# Patient Record
Sex: Female | Born: 1960 | Race: Black or African American | Hispanic: No | Marital: Single
Health system: Southern US, Community
[De-identification: ages and names within clinical notes are randomized; demographics above are authoritative.]

---

## 2019-01-07 ENCOUNTER — Other Ambulatory Visit: Payer: Self-pay

## 2019-01-07 ENCOUNTER — Emergency Department (HOSPITAL_COMMUNITY)
Admission: EM | Admit: 2019-01-07 | Discharge: 2019-01-08 | Disposition: A | Discharge status: Home / Self Care | Payer: BLUE CROSS/BLUE SHIELD | Attending: Emergency Medicine | Admitting: Emergency Medicine

## 2019-01-07 ENCOUNTER — Encounter (HOSPITAL_COMMUNITY): Payer: Self-pay

## 2019-01-07 DIAGNOSIS — M545 Low back pain, unspecified: Secondary | ICD-10-CM

## 2019-01-07 DIAGNOSIS — R1012 Left upper quadrant pain: Secondary | ICD-10-CM | POA: Diagnosis present

## 2019-01-07 NOTE — ED Triage Notes (Signed)
Pt arrived with complaints of left sided back pain, patient describes it as a burning pain. Denies any injury. States she has been taking miralax, reports a hard stool yesterday. Denies any symptoms when urinating.

## 2019-01-07 NOTE — ED Provider Notes (Signed)
[facility] DEPT Provider Note   CSN: [id number] Arrival date & time: 01/07/19  2258     History   Chief Complaint Chief Complaint  Patient presents with  . Back Pain    HPI Christine Hoover is a 58 y.o. female presented today with 2-day history of left flank pain.  Patient reports a moderate intensity burning sensation constant without clear aggravating or alleviating factors or radiation that began 2 days ago, no clear inciting incident.  She denies fever/chills, dysuria/hematuria, nausea/vomiting, abdominal pain, chest pain/shortness of breath, cough/hemoptysis, extremity pain/swelling, fall/injury, vaginal bleeding/discharge or any additional concerns.  Patient reports that she is an otherwise healthy 58 year old female with no known medical conditions or daily medication use.    HPI  History reviewed. No pertinent past medical history.  There are no active problems to display for this patient.   History reviewed. No pertinent surgical history.   OB History   No obstetric history on file.      Home Medications    Prior to Admission medications   Not on File    Family History No family history on file.  Social History Social History   Tobacco Use  . Smoking status: Not on file  Substance Use Topics  . Alcohol use: Not on file  . Drug use: Not on file     Allergies   Patient has no known allergies.   Review of Systems Review of Systems  Constitutional: Negative.  Negative for chills and fever.  Respiratory: Negative.  Negative for cough and shortness of breath.   Cardiovascular: Negative.  Negative for chest pain and leg swelling.  Gastrointestinal: Positive for constipation. Negative for abdominal pain, diarrhea, nausea and vomiting.  Genitourinary: Positive for flank pain. Negative for dysuria, hematuria, pelvic pain, vaginal bleeding and vaginal discharge.  Musculoskeletal: Negative for arthralgias and myalgias.   Neurological: Negative.  Negative for weakness and numbness.  All other systems reviewed and are negative.  Physical Exam Updated Vital Signs BP (!) 125/99 (BP Location: Right Arm)   Pulse 89   Temp 98 F (36.7 C) (Oral)   Resp 18   Ht 5\' 6"  (1.676 m)   Wt 79.8 kg   SpO2 99%   BMI 28.41 kg/m   Physical Exam Constitutional:      General: She is not in acute distress.    Appearance: Normal appearance. She is well-developed. She is obese. She is not ill-appearing or diaphoretic.  HENT:     Head: Normocephalic and atraumatic.     Right Ear: External ear normal.     Left Ear: External ear normal.     Nose: Nose normal.  Eyes:     General: Vision grossly intact. Gaze aligned appropriately.     Pupils: Pupils are equal, round, and reactive to light.  Neck:     Musculoskeletal: Normal range of motion.     Trachea: Trachea and phonation normal. No tracheal deviation.  Cardiovascular:     Rate and Rhythm: Normal rate and regular rhythm.     Pulses:          Dorsalis pedis pulses are 2+ on the right side and 2+ on the left side.  Pulmonary:     Effort: Pulmonary effort is normal. No respiratory distress.  Abdominal:     General: There is no distension.     Palpations: Abdomen is soft.     Tenderness: There is no abdominal tenderness. There is left CVA tenderness. There is  no right CVA tenderness, guarding or rebound. Negative signs include Murphy's sign and McBurney's sign.  Genitourinary:    Comments: Deferred by patient Musculoskeletal: Normal range of motion.     Comments: No midline C/T/L spinal tenderness to palpation, no paraspinal muscle tenderness, no deformity, crepitus, or step-off noted. No sign of injury to the neck or back.  Feet:     Right foot:     Protective Sensation: 3 sites tested. 3 sites sensed.     Left foot:     Protective Sensation: 3 sites tested. 3 sites sensed.  Skin:    General: Skin is warm and dry.     Findings: No ecchymosis, erythema, rash or  wound.  Neurological:     Mental Status: She is alert.     GCS: GCS eye subscore is 4. GCS verbal subscore is 5. GCS motor subscore is 6.     Comments: Speech is clear and goal oriented, follows commands Major Cranial nerves without deficit, no facial droop Moves extremities without ataxia, coordination intact  Psychiatric:        Behavior: Behavior normal.    ED Treatments / Results  Labs (all labs ordered are listed, but only abnormal results are displayed) Labs Reviewed - No data to display  EKG None  Radiology No results found.  Procedures Procedures (including critical care time)  Medications Ordered in ED Medications - No data to display   Initial Impression / Assessment and Plan / ED Course  I have reviewed the triage vital signs and the nursing notes.  Pertinent labs & imaging results that were available during my care of the patient were reviewed by me and considered in my medical decision making (see chart for details).    CBC nonacute Beta-hCG negative Urinalysis with positive nitrates, 6-10 white blood cells, rare bacteria, greater than 500 glucose - Lipase, CMP and CT renal stone study pending.  Pain medication ordered. - Care handoff given to Graciella FreerLindsey Layden, PA-C at shift change, plan of care at this time is to await remainder of blood work, CT imaging, reassess and disposition.    Note: Portions of this report may have been transcribed using voice recognition software. Every effort was made to ensure accuracy; however, inadvertent computerized transcription errors may still be present. Final Clinical Impressions(s) / ED Diagnoses   Final diagnoses:  None    ED Discharge Orders    None       Elizabeth PalauMorelli, Brandon A, PA-C 01/08/19 0046    Linwood DibblesKnapp, Jon, MD 01/18/19 [phone]

## 2019-01-08 ENCOUNTER — Emergency Department (HOSPITAL_COMMUNITY): Payer: BLUE CROSS/BLUE SHIELD

## 2019-01-08 LAB — URINALYSIS, ROUTINE W REFLEX MICROSCOPIC
Bilirubin Urine: NEGATIVE
Glucose, UA: >=500 mg/dL — AB
Hgb urine dipstick: NEGATIVE
Ketones, ur: NEGATIVE mg/dL
Leukocytes,Ua: NEGATIVE
Nitrite: POSITIVE — AB
Protein, ur: NEGATIVE mg/dL
Specific Gravity, Urine: 1.028 (ref 1.005–1.030)
pH: 6 (ref 5.0–8.0)

## 2019-01-08 LAB — CBC WITH DIFFERENTIAL/PLATELET
Abs Immature Granulocytes: 0.03 10*3/uL (ref 0.00–0.07)
Basophils Absolute: 0 10*3/uL (ref 0.0–0.1)
Basophils Relative: 0 %
Eosinophils Absolute: 0.1 10*3/uL (ref 0.0–0.5)
Eosinophils Relative: 1 %
HCT: 46.5 % — ABNORMAL HIGH (ref 36.0–46.0)
Hemoglobin: 15.1 g/dL — ABNORMAL HIGH (ref 12.0–15.0)
Immature Granulocytes: 0 %
Lymphocytes Relative: 42 %
Lymphs Abs: 3.7 10*3/uL (ref 0.7–4.0)
MCH: 30.1 pg (ref 26.0–34.0)
MCHC: 32.5 g/dL (ref 30.0–36.0)
MCV: 92.6 fL (ref 80.0–100.0)
Monocytes Absolute: 0.7 10*3/uL (ref 0.1–1.0)
Monocytes Relative: 8 %
Neutro Abs: 4.3 10*3/uL (ref 1.7–7.7)
Neutrophils Relative %: 49 %
Platelets: 214 10*3/uL (ref 150–400)
RBC: 5.02 MIL/uL (ref 3.87–5.11)
RDW: 13.4 % (ref 11.5–15.5)
WBC: 8.9 10*3/uL (ref 4.0–10.5)
nRBC: 0 % (ref 0.0–0.2)

## 2019-01-08 LAB — COMPREHENSIVE METABOLIC PANEL
ALT: 19 U/L (ref 0–44)
AST: 22 U/L (ref 15–41)
Albumin: 3.8 g/dL (ref 3.5–5.0)
Alkaline Phosphatase: 179 U/L — ABNORMAL HIGH (ref 38–126)
Anion gap: 6 (ref 5–15)
BUN: 13 mg/dL (ref 6–20)
CO2: 27 mmol/L (ref 22–32)
Calcium: 9.3 mg/dL (ref 8.9–10.3)
Chloride: 105 mmol/L (ref 98–111)
Creatinine, Ser: 0.85 mg/dL (ref 0.44–1.00)
GFR calc Af Amer: >60 mL/min (ref >60)
GFR calc non Af Amer: >60 mL/min (ref >60)
Glucose, Bld: 356 mg/dL — ABNORMAL HIGH (ref 70–99)
Potassium: 3.9 mmol/L (ref 3.5–5.1)
Sodium: 138 mmol/L (ref 135–145)
Total Bilirubin: 0.5 mg/dL (ref 0.3–1.2)
Total Protein: 7.4 g/dL (ref 6.5–8.1)

## 2019-01-08 LAB — I-STAT BETA HCG BLOOD, ED (MC, WL, AP ONLY): I-stat hCG, quantitative: <5 m[IU]/mL (ref <5)

## 2019-01-08 LAB — LIPASE, BLOOD: Lipase: 24 U/L (ref 11–51)

## 2019-01-08 MED ORDER — MORPHINE SULFATE (PF) 2 MG/ML IV SOLN
2.0000 mg | Freq: Once | INTRAVENOUS | Status: AC
  Administered 2019-01-08: 01:00:00 2 mg via INTRAVENOUS
  Filled 2019-01-08: qty 1

## 2019-01-08 MED ORDER — LIDOCAINE 5 % EX PTCH: 1.0000 | MEDICATED_PATCH | CUTANEOUS | 0 refills | Status: AC

## 2019-01-08 NOTE — ED Provider Notes (Signed)
Care assumed from Nuala Alpha, [place] with labs and imaging pending.  In brief this is a 58 year old female presented with 2-day history of left flank pain.  Reports a burning sensation that is constant.  She denies fever, chills, urinary symptoms, nausea/vomiting.  Please see note from previous provider for full history/physical exam.     Physical Exam  BP (!) 125/99 (BP Location: Right Arm)   Pulse 89   Temp 98 F (36.7 C) (Oral)   Resp 18   Ht '5\' 6"'$  (1.676 m)   Wt 79.8 kg   SpO2 99%   BMI 28.41 kg/m   Physical Exam  Moving all extremities without any difficulty. Abdomen is soft, non-distended, non-tender. No rigidity, No guarding. No peritoneal signs.  ED Course/Procedures     Procedures  MDM    Plan: Patient pending labs and imaging.  Suspect that this may be kidney stone related.  Plan for discharge home if work-up unremarkable.  I-stat beta negative.  Lipase unremarkable.  CMP shows glucose of 356 and alk phos of 179.  CBC with no leukocytosis.  Hemoglobin stable at 15.1.  UA does show positive nitrites, bacturia, pyuria.  CT shows no renal stones.  No evidence of acute abnormalities noted in the abdomen or pelvis.  There is mention of large colonic stool burden suggesting constipation.  Also there is a small fat-containing umbilical hernia.  Discussed results with patient's.  Patient resting comfortably in bed with no signs of distress.  Encouraged at home supportive care measures. At this time, patient exhibits no emergent life-threatening condition that require further evaluation in ED or admission. Patient had ample opportunity for questions and discussion. All patient's questions were answered with full understanding. Strict return precautions discussed. Patient expresses understanding and agreement to plan.   Portions of this note were generated with [employer]. Dictation errors may occur despite best attempts at proofreading.       Volanda Napoleon, PA-C 01/08/19 [id number]    Randal Buba, April, MD 01/08/19 [phone]

## 2019-01-08 NOTE — Discharge Instructions (Addendum)
As we discussed your CT scan showed that you are constipated.  Continue taking MiraLAX.  Use Lidoderm patch as directed.  Return the emergency department for any worsening pain, fevers or any other worsening or concerning symptoms.

## 2019-12-17 IMAGING — CT CT RENAL STONE PROTOCOL
2 of 4 series · 16 of 46 positions shown, 18 images · non-contrast
Comparison: None.

CLINICAL DATA: Left flank pain.

EXAM:
CT ABDOMEN AND PELVIS WITHOUT CONTRAST
TECHNIQUE: Multidetector CT imaging of the abdomen and pelvis was performed
following the standard protocol without IV contrast.

[Series 2: axial st · axial · 0.64mm/px · z∈[-470,-110]mm · 13 of 81 slices shown, 15 images]
[im 5/81  soft-tissue]
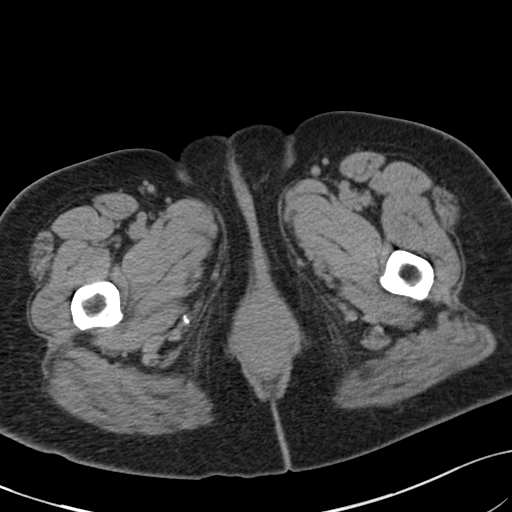
[im 5/81  bone]
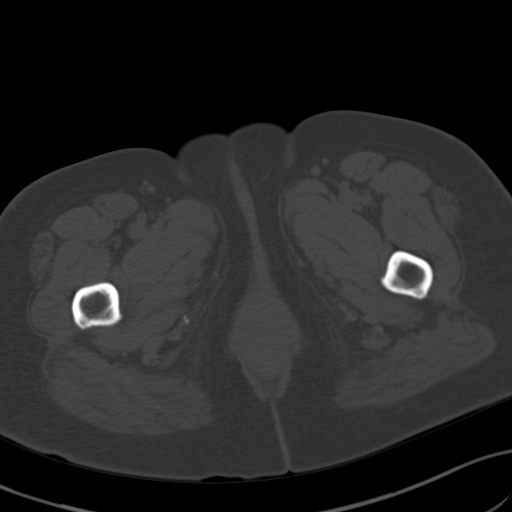
[im 13/81  soft-tissue]
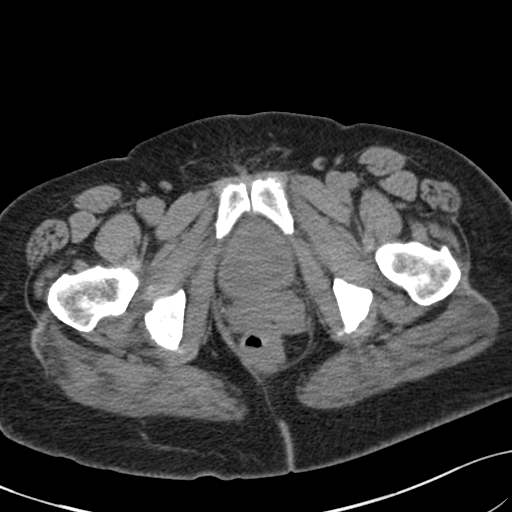
[im 17/81  soft-tissue]
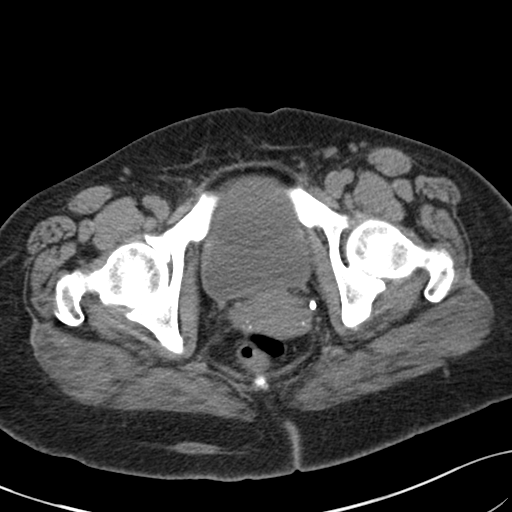
[im 25/81  soft-tissue]
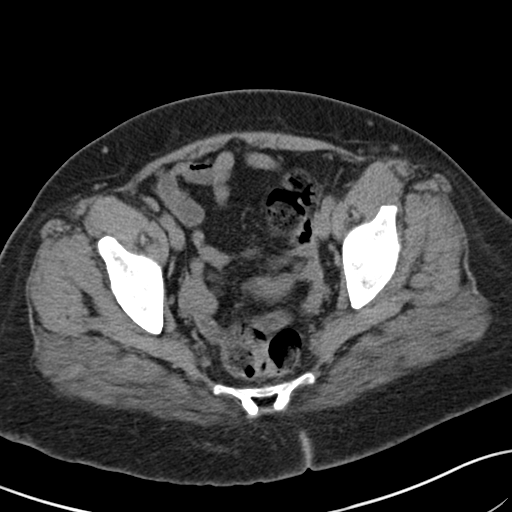
[im 29/81  soft-tissue]
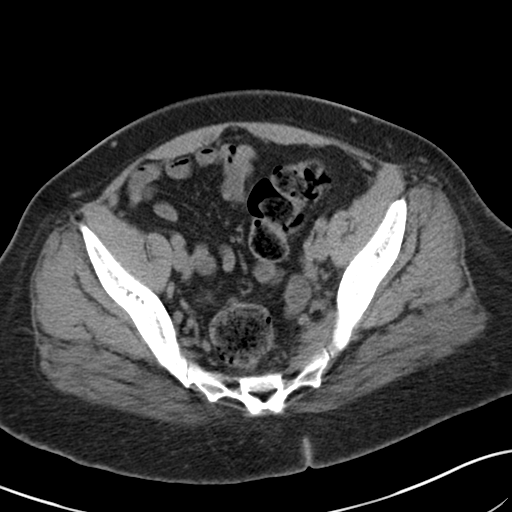
[im 37/81  soft-tissue]
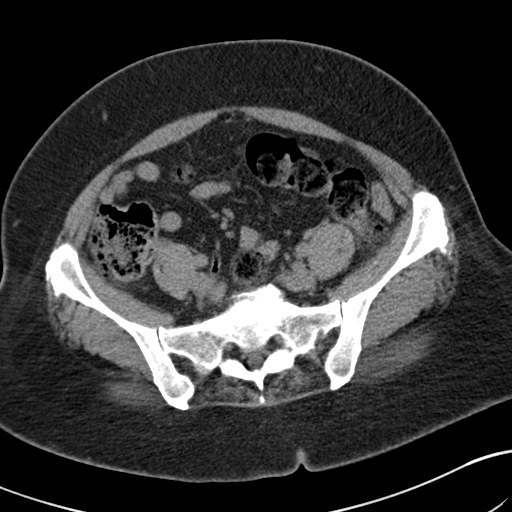
[im 41/81  soft-tissue]
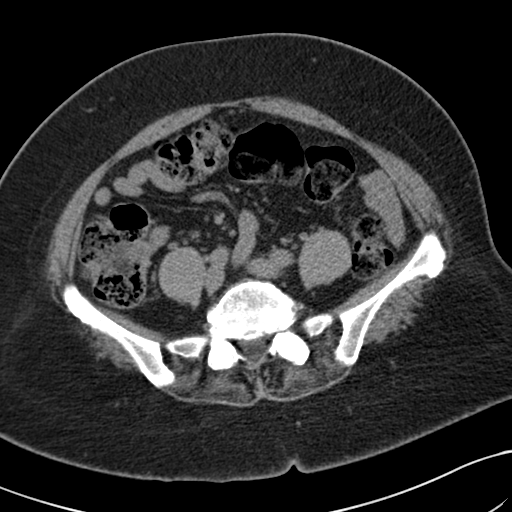
[im 45/81  soft-tissue]
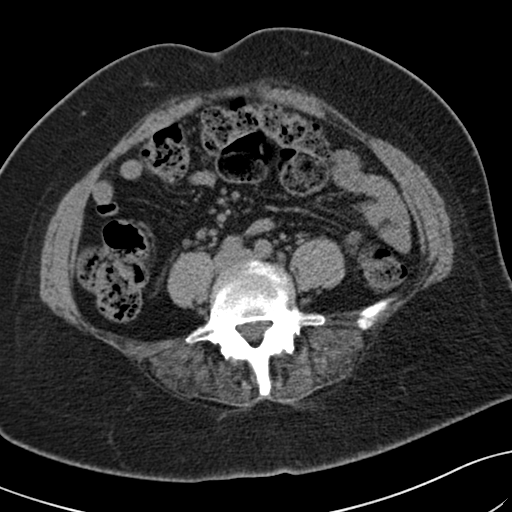
[im 53/81  soft-tissue]
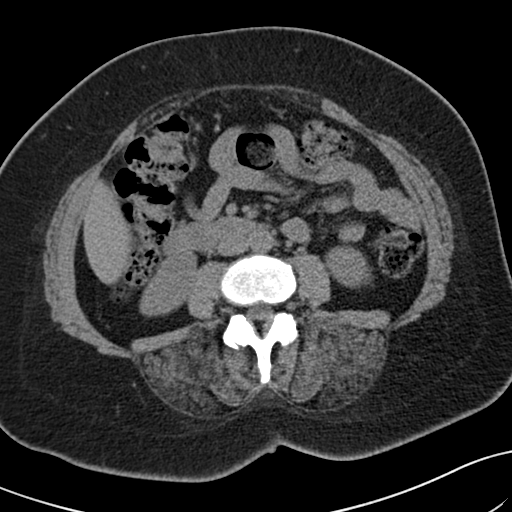
[im 53/81  bone]
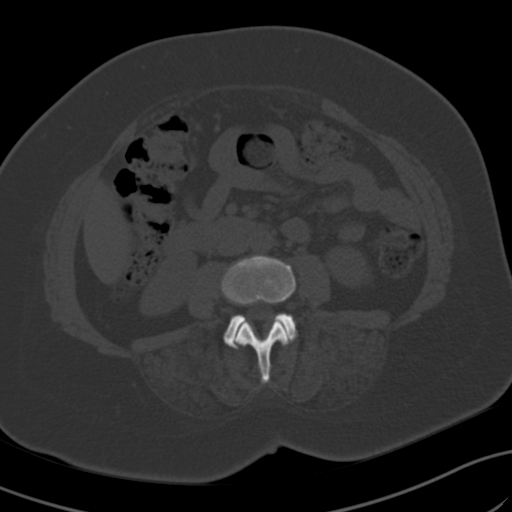
[im 57/81  soft-tissue]
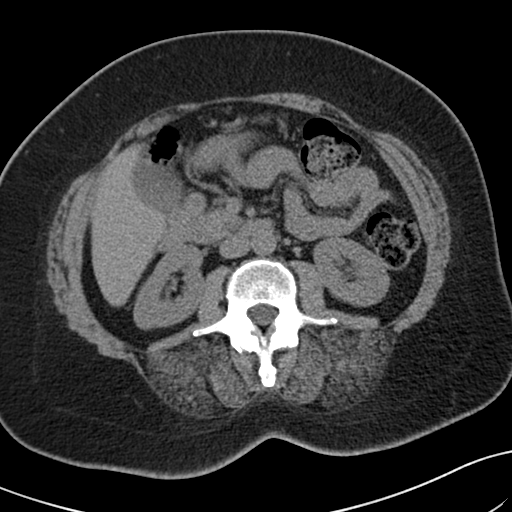
[im 65/81  soft-tissue]
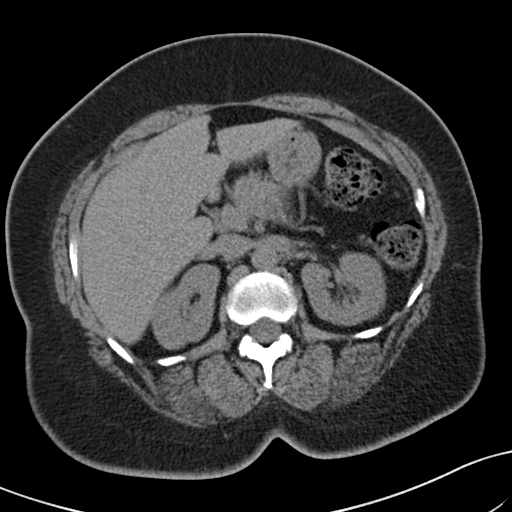
[im 69/81  soft-tissue]
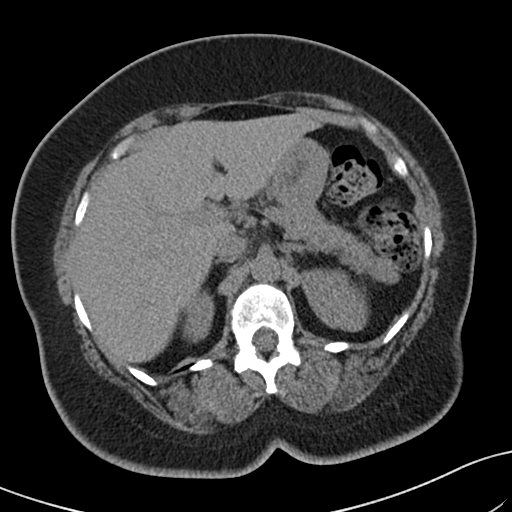
[im 77/81  soft-tissue]
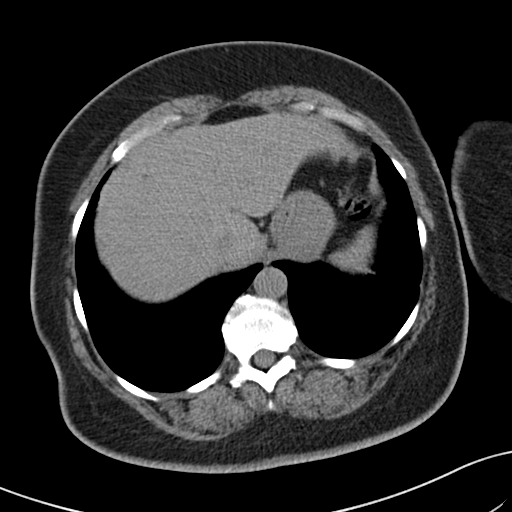

[Series 5: coronal · coronal · 0.73mm/px · 3 of 151 slices shown]
[im 51/151  soft-tissue]
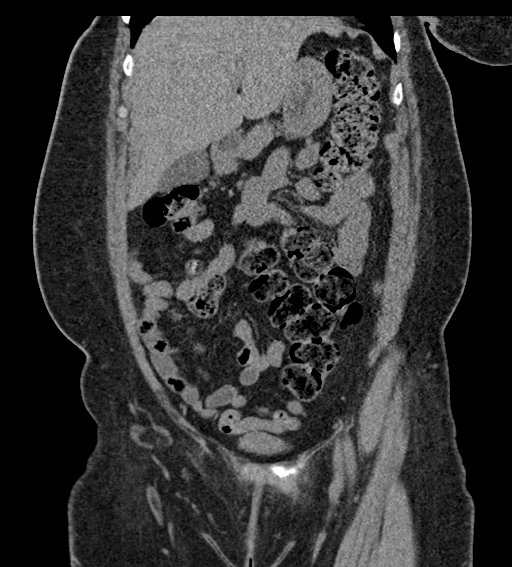
[im 67/151  soft-tissue]
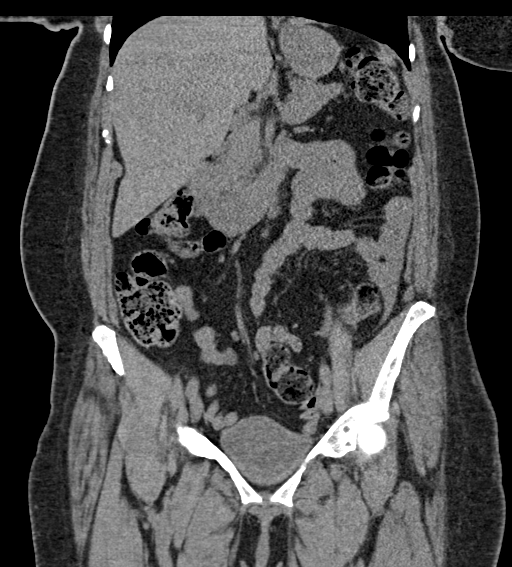
[im 84/151  soft-tissue]
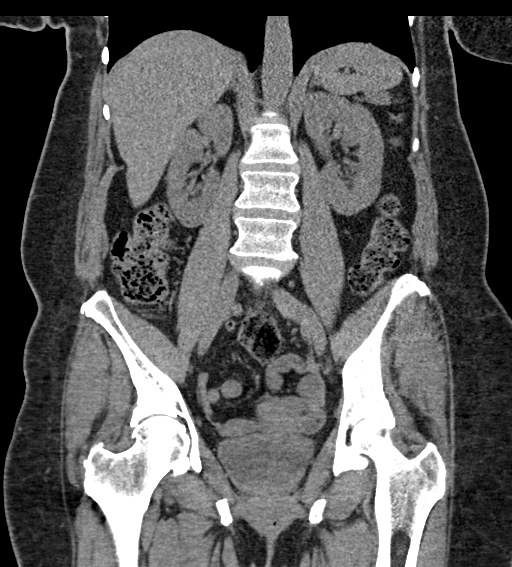

[16 of 46 positions shown; findings below may reference images not displayed]

FINDINGS: Lower chest: Linear left lower lobe atelectasis. No pleural fluid.

Hepatobiliary: Dome of the liver not included in the field of view.
Scattered subcentimeter hepatic hypodensities are too small to
accurately characterize. Gallbladder physiologically distended, no
calcified stone. No biliary dilatation.

Pancreas: No ductal dilatation or inflammation.

Spleen: Normal in size without focal abnormality.

Adrenals/Urinary Tract: Normal adrenal glands. No hydronephrosis. No
perinephric edema. No renal, ureteral, or bladder stones. Urinary
bladder is physiologically distended. No wall thickening.

Stomach/Bowel: Stomach is nondistended. No small bowel inflammation,
wall thickening, or obstruction. Normal appendix. Large colonic
stool burden throughout the entire colon. Colonic tortuosity and
redundancy. No colonic wall thickening or inflammatory change. No
abnormal rectal distention. No significant diverticular disease.

Vascular/Lymphatic: Normal caliber abdominal aorta. Prominent
central mesenteric nodes are likely reactive. No pathologic
adenopathy.

Reproductive: Uterus and bilateral adnexa are unremarkable.

Other: No free air or free fluid. Small fat containing umbilical
hernia.

Musculoskeletal: There are no acute or suspicious osseous
abnormalities. Degenerative change in the lumbar spine with
prominent facet hypertrophy and mild degenerative disc disease.
IMPRESSION: 1. No renal stones or obstructive uropathy. No acute abnormality in
the abdomen/pelvis.
2. Large colonic stool burden with colonic tortuosity suggesting
constipation.
3. Small fat containing umbilical hernia.
